# Patient Record
Sex: Female | Born: 1955 | Race: Black or African American | Hispanic: No | Marital: Married | Smoking: Never smoker
Health system: Southern US, Community
[De-identification: ages and names within clinical notes are randomized; demographics above are authoritative.]

## PROBLEM LIST (undated history)

## (undated) DIAGNOSIS — I10 Essential (primary) hypertension: Secondary | ICD-10-CM

## (undated) DIAGNOSIS — M1990 Unspecified osteoarthritis, unspecified site: Secondary | ICD-10-CM

## (undated) DIAGNOSIS — K219 Gastro-esophageal reflux disease without esophagitis: Secondary | ICD-10-CM

## (undated) DIAGNOSIS — E785 Hyperlipidemia, unspecified: Secondary | ICD-10-CM

## (undated) HISTORY — PX: NO PAST SURGERIES: SHX2092

## (undated) HISTORY — PX: OTHER SURGICAL HISTORY: SHX169

## (undated) HISTORY — DX: Hyperlipidemia, unspecified: E78.5

## (undated) HISTORY — DX: Gastro-esophageal reflux disease without esophagitis: K21.9

## (undated) HISTORY — PX: COLONOSCOPY: SHX5424

## (undated) HISTORY — DX: Essential (primary) hypertension: I10

---

## 2021-02-15 ENCOUNTER — Other Ambulatory Visit: Payer: Self-pay

## 2021-02-15 ENCOUNTER — Ambulatory Visit (INDEPENDENT_AMBULATORY_CARE_PROVIDER_SITE_OTHER): Payer: PRIVATE HEALTH INSURANCE | Admitting: Physician Assistant

## 2021-02-15 ENCOUNTER — Encounter: Payer: Self-pay | Admitting: Physician Assistant

## 2021-02-15 ENCOUNTER — Telehealth (INDEPENDENT_AMBULATORY_CARE_PROVIDER_SITE_OTHER): Payer: PRIVATE HEALTH INSURANCE | Admitting: Physician Assistant

## 2021-02-15 VITALS — BP 130/82 | HR 68 | Ht 63.75 in | Wt 180.2 lb

## 2021-02-15 DIAGNOSIS — K219 Gastro-esophageal reflux disease without esophagitis: Secondary | ICD-10-CM

## 2021-02-15 DIAGNOSIS — K6289 Other specified diseases of anus and rectum: Secondary | ICD-10-CM

## 2021-02-15 DIAGNOSIS — Z1211 Encounter for screening for malignant neoplasm of colon: Secondary | ICD-10-CM

## 2021-02-15 MED ORDER — PLENVU 140 G PO SOLR: ORAL | 0 refills | Status: DC

## 2021-02-15 NOTE — Patient Instructions (Signed)
If you are age 65 or older, your body mass index should be between 23-30. Your Body mass index is 31.17 kg/m. If this is out of the aforementioned range listed, please consider follow up with your Primary Care Provider.  If you are age 19 or younger, your body mass index should be between 19-25. Your Body mass index is 31.17 kg/m. If this is out of the aformentioned range listed, please consider follow up with your Primary Care Provider.   You have been scheduled for a colonoscopy. Please follow written instructions given to you at your visit today.  Please pick up your prep supplies at the pharmacy within the next 1-3 days. If you use inhalers (even only as needed), please bring them with you on the day of your procedure.  Due to recent changes in healthcare laws, you may see the results of your imaging and laboratory studies on MyChart before your provider has had a chance to review them.  We understand that in some cases there may be results that are confusing or concerning to you. Not all laboratory results come back in the same time frame and the provider may be waiting for multiple results in order to interpret others.  Please give [place] 48 hours in order for your provider to thoroughly review all the results before contacting the office for clarification of your results.    The [place] [facility] providers would like to encourage you to use [facility] to communicate with providers for non-urgent requests or questions.  Due to long hold times on the telephone, sending your provider a message by [facility] may be a faster and more efficient way to get a response.  Please allow 48 business hours for a response.  Please remember that this is for non-urgent requests.   It was a pleasure to see you today!  Thank you for trusting me with your gastrointestinal care!    Ellouise Newer , PA-C

## 2021-02-15 NOTE — Telephone Encounter (Signed)
Opened in error.  JLL

## 2021-02-15 NOTE — Progress Notes (Signed)
Chief Complaint: Discuss colonoscopy, rectal pain and GERD  HPI:    Monique Shannon is a 65 year old [place] female with a past medical history as listed below, who presents to clinic today to discuss a screening colonoscopy as well as a complaint of rectal pain and GERD.    Today, patient presents to clinic accompanied by her son, she is originally from [place] and her son helps translate for her.  Together they explain that she has had chronic reflux for years, but this only comes on after she "eats something that starts it", which is not very often.  When it does she uses Omeprazole and it goes away.  Denies any abdominal pain or dysphagia.    Also discusses a rectal discomfort which she will have that "comes out of nowhere".  She tells me sometimes she can just be laying down at night and she will have a pain in her "anus", this will last for 3 to 5 minutes and then goes away.  It has nothing to do with bowel movements.  No other abdominal pain.  No blood in her stool.  This only happens about once every 2 months.    Denies previous screening for colon cancer.    Patient is only in town with visiting her son from [place] for the next 2 months and he would like her to have procedure before she leaves.    Denies fever, chills, weight loss or symptoms that awaken her from sleep.  Past Medical History:  Diagnosis Date  . GERD (gastroesophageal reflux disease)   . Hyperlipidemia   . Hypertension     Past Surgical History:  Procedure Laterality Date  . none      Current Outpatient Medications  Medication Sig Dispense Refill  . amLODipine (NORVASC) 10 MG tablet Take 1 tablet by mouth daily.    . meloxicam (MOBIC) 15 MG tablet Take 1 tablet by mouth daily.    . nortriptyline (PAMELOR) 10 MG capsule Take 1 capsule by mouth daily.    Marland Kitchen omeprazole (PRILOSEC) 40 MG capsule Take 1 capsule by mouth daily as needed.    Marland Kitchen PEG-KCl-NaCl-NaSulf-Na Asc-C (PLENVU) 140 g SOLR Use as directed.  Manufacturer's coupon Universal coupon code:BIN: P2366821; GROUP: [id number]; PCN: [id number]; ID: [id number]; PAY NO MORE $50; NO prior authorization 1 each 0  . rosuvastatin (CRESTOR) 10 MG tablet Take 1 tablet by mouth every evening.     No current facility-administered medications for this visit.    Allergies as of 02/15/2021 - Review Complete 02/15/2021  Allergen Reaction Noted  . Other  02/15/2021    Family History  Problem Relation Age of Onset  . Colon cancer Neg Hx   . Esophageal cancer Neg Hx   . Pancreatic cancer Neg Hx   . Stomach cancer Neg Hx   . Liver disease Neg Hx     Social History   Socioeconomic History  . Marital status: Married    Spouse name: Not on file  . Number of children: Not on file  . Years of education: Not on file  . Highest education level: Not on file  Occupational History  . Not on file  Tobacco Use  . Smoking status: Never Smoker  . Smokeless tobacco: Never Used  Substance and Sexual Activity  . Alcohol use: Yes    Comment: rare  . Drug use: Never  . Sexual activity: Not on file  Other Topics Concern  . Not on file  Social History Narrative  .  Not on file   Social Determinants of Health   Financial Resource Strain: Not on file  Food Insecurity: Not on file  Transportation Needs: Not on file  Physical Activity: Not on file  Stress: Not on file  Social Connections: Not on file  Intimate Partner Violence: Not on file    Review of Systems:    Constitutional: No weight loss, fever or chills Skin: No rash Cardiovascular: No chest pain Respiratory: No SOB Gastrointestinal: See HPI and otherwise negative Genitourinary: No dysuria Neurological: No headache, dizziness or syncope Musculoskeletal: No new muscle or joint pain Hematologic: No bleeding Psychiatric: No history of depression or anxiety   Physical Exam:  Vital signs: BP 130/82   Pulse 68   Ht 5' 3.75" (1.619 m)   Wt 180 lb 3.2 oz (81.7 kg)   BMI 31.17 kg/m    Constitutional:   Pleasant AA female appears to be in NAD, Well developed, Well nourished, alert and cooperative Head:  Normocephalic and atraumatic. Eyes:   PEERL, EOMI. No icterus. Conjunctiva pink. Ears:  Normal auditory acuity. Neck:  Supple Throat: Oral cavity and pharynx without inflammation, swelling or lesion.  Respiratory: Respirations even and unlabored. Lungs clear to auscultation bilaterally.   No wheezes, crackles, or rhonchi.  Cardiovascular: Normal S1, S2. No MRG. Regular rate and rhythm. No peripheral edema, cyanosis or pallor.  Gastrointestinal:  Soft, nondistended, nontender. No rebound or guarding. Normal bowel sounds. No appreciable masses or hepatomegaly. Rectal:  Not performed.  Msk:  Symmetrical without gross deformities. Without edema, no deformity or joint abnormality.  Neurologic:  Alert and  oriented x4;  grossly normal neurologically.  Skin:   Dry and intact without significant lesions or rashes. Psychiatric:  Demonstrates good judgement and reason without abnormal affect or behaviors.  No recent labs or imaging.  Assessment: 1.  Screening for colorectal cancer: Patient is 62 and never had a screening colonoscopy 2.  Rectal pain: Once every 2 months, last for 3 to 5 minutes, unrelated to bowel movements, no other abdominal pain or change in stools, no rectal bleeding; consider musculoskeletal versus proctalgia fugax versus other 3.  GERD: Very occasional, related to diet  Plan: 1.  Scheduled patient for screening colonoscopy in the [place] with Dr. Ardis Hughs.  Did provide the patient a detailed list of risks for the procedure and she agrees to proceed. 2.  Briefly discussed reflux and antireflux diet and lifestyle modifications.  The symptoms are not frequent enough for her to be on medicine.  She can continue Omeprazole as needed 3.  Briefly discussed rectal pain, not sure if this is truly related to her GI system, could be musculoskeletal as it seems to come and go  once every couple of months.  Certainly we will take a good look in her rectum at time of colonoscopy. 4.  Patient to follow in clinic per recommendations from Dr. Ardis Hughs after time of procedure.  Ellouise Newer, PA-C [facility] 02/15/2021, 10:30 AM

## 2021-02-18 ENCOUNTER — Telehealth: Payer: Self-pay | Admitting: Physician Assistant

## 2021-02-18 ENCOUNTER — Other Ambulatory Visit: Payer: Self-pay

## 2021-02-18 NOTE — Progress Notes (Signed)
I agree with the above note, plan 

## 2021-02-18 NOTE — Telephone Encounter (Signed)
Patient called requesting a generic brand prep medication due to cost for the Plenvu. Procedure is scheduled for this Weds would need it by today.

## 2021-02-18 NOTE — Telephone Encounter (Signed)
Returned patient's call and spoke with her son and asked if he could come pick up a sample prep of Plenvu. He stated that he would come pick up prep around 3:30 pm.

## 2021-02-20 ENCOUNTER — Encounter: Payer: Self-pay | Admitting: Gastroenterology

## 2021-02-20 ENCOUNTER — Other Ambulatory Visit: Payer: Self-pay | Admitting: Internal Medicine

## 2021-02-20 ENCOUNTER — Ambulatory Visit (AMBULATORY_SURGERY_CENTER): Payer: PRIVATE HEALTH INSURANCE | Admitting: Gastroenterology

## 2021-02-20 ENCOUNTER — Other Ambulatory Visit: Payer: Self-pay

## 2021-02-20 VITALS — BP 141/85 | HR 80 | Temp 96.8°F | Resp 17 | Ht 63.75 in | Wt 180.0 lb

## 2021-02-20 DIAGNOSIS — D122 Benign neoplasm of ascending colon: Secondary | ICD-10-CM

## 2021-02-20 DIAGNOSIS — K6289 Other specified diseases of anus and rectum: Secondary | ICD-10-CM | POA: Diagnosis not present

## 2021-02-20 DIAGNOSIS — E2839 Other primary ovarian failure: Secondary | ICD-10-CM

## 2021-02-20 DIAGNOSIS — Z1211 Encounter for screening for malignant neoplasm of colon: Secondary | ICD-10-CM

## 2021-02-20 MED ORDER — SODIUM CHLORIDE 0.9 % IV SOLN: 500.0000 mL | Freq: Once | INTRAVENOUS | Status: DC

## 2021-02-20 NOTE — Progress Notes (Signed)
pt tolerated well. VSS. awake and to recovery. Report given to RN.  

## 2021-02-20 NOTE — Patient Instructions (Signed)
Discharge instructions given. Handout on polyps. Resume previous medications. YOU HAD AN ENDOSCOPIC PROCEDURE TODAY AT THE [facility]:   Refer to the procedure report that was given to you for any specific questions about what was found during the examination.  If the procedure report does not answer your questions, please call your gastroenterologist to clarify.  If you requested that your care partner not be given the details of your procedure findings, then the procedure report has been included in a sealed envelope for you to review at your convenience later.  YOU SHOULD EXPECT: Some feelings of bloating in the abdomen. Passage of more gas than usual.  Walking can help get rid of the air that was put into your GI tract during the procedure and reduce the bloating. If you had a lower endoscopy (such as a colonoscopy or flexible sigmoidoscopy) you may notice spotting of blood in your stool or on the toilet paper. If you underwent a bowel prep for your procedure, you may not have a normal bowel movement for a few days.  Please Note:  You might notice some irritation and congestion in your nose or some drainage.  This is from the oxygen used during your procedure.  There is no need for concern and it should clear up in a day or so.  SYMPTOMS TO REPORT IMMEDIATELY:  Following lower endoscopy (colonoscopy or flexible sigmoidoscopy):  Excessive amounts of blood in the stool  Significant tenderness or worsening of abdominal pains  Swelling of the abdomen that is new, acute  Fever of 100F or higher   For urgent or emergent issues, a gastroenterologist can be reached at any hour by calling [phone]. Do not use MyChart messaging for urgent concerns.    DIET:  We do recommend a small meal at first, but then you may proceed to your regular diet.  Drink plenty of fluids but you should avoid alcoholic beverages for 24 hours.  ACTIVITY:  You should plan to take it easy for the rest  of today and you should NOT DRIVE or use heavy machinery until tomorrow (because of the sedation medicines used during the test).    FOLLOW UP: Our staff will call the number listed on your records 48-72 hours following your procedure to check on you and address any questions or concerns that you may have regarding the information given to you following your procedure. If we do not reach you, we will leave a message.  We will attempt to reach you two times.  During this call, we will ask if you have developed any symptoms of COVID 19. If you develop any symptoms (ie: fever, flu-like symptoms, shortness of breath, cough etc.) before then, please call [phone].  If you test positive for Covid 19 in the 2 weeks post procedure, please call and report this information to us.    If any biopsies were taken you will be contacted by phone or by letter within the next 1-3 weeks.  Please call us at [phone] if you have not heard about the biopsies in 3 weeks.    SIGNATURES/CONFIDENTIALITY: You and/or your care partner have signed paperwork which will be entered into your electronic medical record.  These signatures attest to the fact that that the information above on your After Visit Summary has been reviewed and is understood.  Full responsibility of the confidentiality of this discharge information lies with you and/or your care-partner.  

## 2021-02-20 NOTE — Op Note (Signed)
[place] Patient Name: Monique Shannon Procedure Date: 02/20/2021 2:50 PM MRN: [id number] Endoscopist: Milus Banister , MD Age: 64 Referring MD:  Date of Birth: January 01, 1956 Gender: Female Account #: [id number] Procedure:                Colonoscopy Indications:              Screening for colorectal malignant neoplasm, also                            intermittent rectal pain Medicines:                Monitored Anesthesia Care Procedure:                Pre-Anesthesia Assessment:                           - Prior to the procedure, a History and Physical                            was performed, and patient medications and                            allergies were reviewed. The patient's tolerance of                            previous anesthesia was also reviewed. The risks                            and benefits of the procedure and the sedation                            options and risks were discussed with the patient.                            All questions were answered, and informed consent                            was obtained. Prior Anticoagulants: The patient has                            taken no previous anticoagulant or antiplatelet                            agents. ASA Grade Assessment: II - A patient with                            mild systemic disease. After reviewing the risks                            and benefits, the patient was deemed in                            satisfactory condition to undergo the procedure.  After obtaining informed consent, the colonoscope                            was passed under direct vision. Throughout the                            procedure, the patient's blood pressure, pulse, and                            oxygen saturations were monitored continuously. The                            Olympus CF-HQ190 [id number]) Colonoscope was                            introduced through the anus and  advanced to the the                            cecum, identified by appendiceal orifice and                            ileocecal valve. The colonoscopy was performed                            without difficulty. The patient tolerated the                            procedure well. The quality of the bowel                            preparation was good. The ileocecal valve,                            appendiceal orifice, and rectum were photographed. Scope In: 3:17:19 PM Scope Out: 3:29:45 PM Scope Withdrawal Time: 0 hours 7 minutes 15 seconds  Total Procedure Duration: 0 hours 12 minutes 26 seconds  Findings:                 Two sessile polyps were found in the ascending                            colon. The polyps were 2 to 3 mm in size. These                            polyps were removed with a cold snare. Resection                            and retrieval were complete.                           The exam was otherwise without abnormality on                            direct and retroflexion views. Complications:  No immediate complications. Estimated blood loss:                            None. Estimated Blood Loss:     Estimated blood loss: none. Impression:               - Two 2 to 3 mm polyps in the ascending colon,                            removed with a cold snare. Resected and retrieved.                           - The examination was otherwise normal on direct                            and retroflexion views. Recommendation:           - Patient has a contact number available for                            emergencies. The signs and symptoms of potential                            delayed complications were discussed with the                            patient. Return to normal activities tomorrow.                            Written discharge instructions were provided to the                            patient.                           - Resume previous diet.                            - Continue present medications.                           - Await pathology results. Milus Banister, MD 02/20/2021 3:35:31 PM This report has been signed electronically.

## 2021-02-20 NOTE — Progress Notes (Signed)
Called to room to assist during endoscopic procedure.  Patient ID and intended procedure confirmed with present staff. Received instructions for my participation in the procedure from the performing physician.  

## 2021-02-20 NOTE — Progress Notes (Signed)
VS by [facility]

## 2021-02-22 ENCOUNTER — Telehealth: Payer: Self-pay | Admitting: *Deleted

## 2021-02-22 NOTE — Telephone Encounter (Signed)
  Follow up Call-  Call back number 02/20/2021  Post procedure Call Back phone  # [phone]  Permission to leave phone message Yes     Patient questions:  Do you have a fever, pain , or abdominal swelling? No. Pain Score  0 *  Have you tolerated food without any problems? Yes.    Have you been able to return to your normal activities? Yes.    Do you have any questions about your discharge instructions: Diet   No. Medications  No. Follow up visit  No.  Do you have questions or concerns about your Care? No.  Actions: * If pain score is 4 or above: No action needed, pain <4.  Have you developed a fever since your procedure? no  2.   Have you had an respiratory symptoms (SOB or cough) since your procedure? no  3.   Have you tested positive for COVID 19 since your procedure no  4.   Have you had any family members/close contacts diagnosed with the COVID 19 since your procedure?  no   If yes to any of these questions please route to Joylene John, RN and Joella Prince, RN

## 2021-02-27 ENCOUNTER — Other Ambulatory Visit: Payer: Self-pay | Admitting: Internal Medicine

## 2021-02-27 DIAGNOSIS — E2839 Other primary ovarian failure: Secondary | ICD-10-CM

## 2021-02-28 ENCOUNTER — Ambulatory Visit
Admission: RE | Admit: 2021-02-28 | Discharge: 2021-02-28 | Disposition: A | Discharge status: Home / Self Care | Payer: PRIVATE HEALTH INSURANCE | Source: Ambulatory Visit | Attending: Internal Medicine | Admitting: Internal Medicine

## 2021-02-28 ENCOUNTER — Other Ambulatory Visit: Payer: Self-pay

## 2021-02-28 DIAGNOSIS — E2839 Other primary ovarian failure: Secondary | ICD-10-CM

## 2021-02-28 DIAGNOSIS — M19011 Primary osteoarthritis, right shoulder: Secondary | ICD-10-CM | POA: Diagnosis present

## 2021-03-01 ENCOUNTER — Other Ambulatory Visit: Payer: Self-pay

## 2021-03-01 ENCOUNTER — Ambulatory Visit
Admission: RE | Admit: 2021-03-01 | Discharge: 2021-03-01 | Disposition: A | Discharge status: Home / Self Care | Payer: PRIVATE HEALTH INSURANCE | Source: Ambulatory Visit | Attending: Internal Medicine | Admitting: Internal Medicine

## 2021-03-01 DIAGNOSIS — E2839 Other primary ovarian failure: Secondary | ICD-10-CM

## 2021-03-04 ENCOUNTER — Encounter: Payer: Self-pay | Admitting: Gastroenterology

## 2021-03-05 ENCOUNTER — Ambulatory Visit (HOSPITAL_COMMUNITY)
Admission: EM | Admit: 2021-03-05 | Discharge: 2021-03-05 | Disposition: A | Discharge status: Home / Self Care | Payer: PRIVATE HEALTH INSURANCE | Attending: Emergency Medicine | Admitting: Emergency Medicine

## 2021-03-05 ENCOUNTER — Encounter (HOSPITAL_COMMUNITY): Payer: Self-pay | Admitting: Emergency Medicine

## 2021-03-05 ENCOUNTER — Ambulatory Visit (INDEPENDENT_AMBULATORY_CARE_PROVIDER_SITE_OTHER): Payer: PRIVATE HEALTH INSURANCE

## 2021-03-05 ENCOUNTER — Other Ambulatory Visit: Payer: Self-pay

## 2021-03-05 DIAGNOSIS — R079 Chest pain, unspecified: Secondary | ICD-10-CM

## 2021-03-05 DIAGNOSIS — R0789 Other chest pain: Secondary | ICD-10-CM | POA: Diagnosis not present

## 2021-03-05 DIAGNOSIS — S161XXA Strain of muscle, fascia and tendon at neck level, initial encounter: Secondary | ICD-10-CM | POA: Diagnosis not present

## 2021-03-05 DIAGNOSIS — M25561 Pain in right knee: Secondary | ICD-10-CM | POA: Diagnosis not present

## 2021-03-05 DIAGNOSIS — S8001XA Contusion of right knee, initial encounter: Secondary | ICD-10-CM

## 2021-03-05 NOTE — Discharge Instructions (Addendum)
Your x-rays were negative.  Rest ice to affected areas may take over-the-counter Tylenol as label directed for discomfort.  May also try a lidocaine patch that she can get over-the-counter.  Follow-up with your PCP
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
## 2021-03-11 NOTE — Progress Notes (Signed)
Pt. Needs orders for upcoming surgery.PAT and labs on: 03/12/21.

## 2021-03-11 NOTE — Patient Instructions (Addendum)
DUE TO COVID-19 ONLY ONE VISITOR IS ALLOWED TO COME WITH YOU AND STAY IN THE WAITING ROOM ONLY DURING PRE OP AND PROCEDURE DAY OF SURGERY. THE 1 VISITOR  MAY VISIT WITH YOU AFTER SURGERY IN YOUR PRIVATE ROOM DURING VISITING HOURS ONLY!               Monique Shannon   Your procedure is scheduled on: 03/26/21   Report to [facility] Main  Entrance   Report to admitting at: 8:20 AM     Call this number if you have problems the morning of surgery [phone]    Remember: NO SOLID FOOD AFTER MIDNIGHT THE NIGHT PRIOR TO SURGERY. NOTHING BY MOUTH EXCEPT CLEAR LIQUIDS UNTIL: 7:50 AM . PLEASE FINISH ENSURE DRINK PER SURGEON ORDER  WHICH NEEDS TO BE COMPLETED AT : 7:50 AM.   CLEAR LIQUID DIET  Foods Allowed                                                                     Foods Excluded  Coffee and tea, regular and decaf                             liquids that you cannot  Plain Jell-O any favor except red or purple                                           see through such as: Fruit ices (not with fruit pulp)                                     milk, soups, orange juice  Iced Popsicles                                    All solid food Carbonated beverages, regular and diet                                    Cranberry, grape and apple juices Sports drinks like Gatorade Lightly seasoned clear broth or consume(fat free) Sugar, honey syrup  Sample Menu Breakfast                                Lunch                                     Supper Cranberry juice                    Beef broth                            Chicken broth Jell-O  Grape juice                           Apple juice Coffee or tea                        Jell-O                                      Popsicle                                                Coffee or tea                        Coffee or tea  _____________________________________________________________________   BRUSH  YOUR TEETH MORNING OF SURGERY AND RINSE YOUR MOUTH OUT, NO CHEWING GUM CANDY OR MINTS.    Take these medicines the morning of surgery with A SIP OF WATER: amlodipine,omeprazole.                               You may not have any metal on your body including hair pins and              piercings  Do not wear jewelry, make-up, lotions, powders or perfumes, deodorant             Do not wear nail polish on your fingernails.  Do not shave  48 hours prior to surgery.    Do not bring valuables to the hospital. [place].  Contacts, dentures or bridgework may not be worn into surgery.  Leave suitcase in the car. After surgery it may be brought to your room.     Patients discharged the day of surgery will not be allowed to drive home. IF YOU ARE HAVING SURGERY AND GOING HOME THE SAME DAY, YOU MUST HAVE AN ADULT TO DRIVE YOU HOME AND BE WITH YOU FOR 24 HOURS. YOU MAY GO HOME BY TAXI OR UBER OR ORTHERWISE, BUT AN ADULT MUST ACCOMPANY YOU HOME AND STAY WITH YOU FOR 24 HOURS.  Name and phone number of your driver:  Special Instructions: N/A              Please read over the following fact sheets you were given: _____________________________________________________________________           [facility] - Preparing for Surgery Before surgery, you can play an important role.  Because skin is not sterile, your skin needs to be as free of germs as possible.  You can reduce the number of germs on your skin by washing with CHG (chlorahexidine gluconate) soap before surgery.  CHG is an antiseptic cleaner which kills germs and bonds with the skin to continue killing germs even after washing. Please DO NOT use if you have an allergy to CHG or antibacterial soaps.  If your skin becomes reddened/irritated stop using the CHG and inform your nurse when you arrive at Short Stay. Do not shave (including legs and underarms) for at least 48 hours prior to the first  CHG  shower.  You may shave your face/neck. Please follow these instructions carefully:  1.  Shower with CHG Soap the night before surgery and the  morning of Surgery.  2.  If you choose to wash your hair, wash your hair first as usual with your  normal  shampoo.  3.  After you shampoo, rinse your hair and body thoroughly to remove the  shampoo.                           4.  Use CHG as you would any other liquid soap.  You can apply chg directly  to the skin and wash                       Gently with a scrungie or clean washcloth.  5.  Apply the CHG Soap to your body ONLY FROM THE NECK DOWN.   Do not use on face/ open                           Wound or open sores. Avoid contact with eyes, ears mouth and genitals (private parts).                       Wash face,  Genitals (private parts) with your normal soap.             6.  Wash thoroughly, paying special attention to the area where your surgery  will be performed.  7.  Thoroughly rinse your body with warm water from the neck down.  8.  DO NOT shower/wash with your normal soap after using and rinsing off  the CHG Soap.                9.  Pat yourself dry with a clean towel.            10.  Wear clean pajamas.            11.  Place clean sheets on your bed the night of your first shower and do not  sleep with pets. Day of Surgery : Do not apply any lotions/deodorants the morning of surgery.  Please wear clean clothes to the hospital/surgery center.  FAILURE TO FOLLOW THESE INSTRUCTIONS MAY RESULT IN THE CANCELLATION OF YOUR SURGERY PATIENT SIGNATURE_________________________________  NURSE SIGNATURE__________________________________  ________________________________________________________________________    [facility]- Preparing for Total Shoulder Arthroplasty    Before surgery, you can play an important role. Because skin is not sterile, your skin needs to be as free of germs as possible. You can reduce the number of germs on your skin by  using the following products. Benzoyl Peroxide Gel Reduces the number of germs present on the skin Applied twice a day to shoulder area starting two days before surgery    ==================================================================  Please follow these instructions carefully:  BENZOYL PEROXIDE 5% GEL  Please do not use if you have an allergy to benzoyl peroxide.   If your skin becomes reddened/irritated stop using the benzoyl peroxide.  Starting two days before surgery, apply as follows: Apply benzoyl peroxide in the morning and at night. Apply after taking a shower. If you are not taking a shower clean entire shoulder front, back, and side along with the armpit with a clean wet washcloth.  Place a quarter-sized dollop on your shoulder and rub in thoroughly, making sure to cover the  front, back, and side of your shoulder, along with the armpit.   2 days before ____ AM   ____ PM              1 day before ____ AM   ____ PM                         Do this twice a day for two days.  (Last application is the night before surgery, AFTER using the CHG soap as described below).  Do NOT apply benzoyl peroxide gel on the day of surgery.   Incentive Spirometer  An incentive spirometer is a tool that can help keep your lungs clear and active. This tool measures how well you are filling your lungs with each breath. Taking long deep breaths may help reverse or decrease the chance of developing breathing (pulmonary) problems (especially infection) following: A long period of time when you are unable to move or be active. BEFORE THE PROCEDURE  If the spirometer includes an indicator to show your best effort, your nurse or respiratory therapist will set it to a desired goal. If possible, sit up straight or lean slightly forward. Try not to slouch. Hold the incentive spirometer in an upright position. INSTRUCTIONS FOR USE  Sit on the edge of your bed if possible, or sit up as far as you can in  bed or on a chair. Hold the incentive spirometer in an upright position. Breathe out normally. Place the mouthpiece in your mouth and seal your lips tightly around it. Breathe in slowly and as deeply as possible, raising the piston or the ball toward the top of the column. Hold your breath for 3-5 seconds or for as long as possible. Allow the piston or ball to fall to the bottom of the column. Remove the mouthpiece from your mouth and breathe out normally. Rest for a few seconds and repeat Steps 1 through 7 at least 10 times every 1-2 hours when you are awake. Take your time and take a few normal breaths between deep breaths. The spirometer may include an indicator to show your best effort. Use the indicator as a goal to work toward during each repetition. After each set of 10 deep breaths, practice coughing to be sure your lungs are clear. If you have an incision (the cut made at the time of surgery), support your incision when coughing by placing a pillow or rolled up towels firmly against it. Once you are able to get out of bed, walk around indoors and cough well. You may stop using the incentive spirometer when instructed by your caregiver.  RISKS AND COMPLICATIONS Take your time so you do not get dizzy or light-headed. If you are in pain, you may need to take or ask for pain medication before doing incentive spirometry. It is harder to take a deep breath if you are having pain. AFTER USE Rest and breathe slowly and easily. It can be helpful to keep track of a log of your progress. Your caregiver can provide you with a simple table to help with this. If you are using the spirometer at home, follow these instructions: [place] IF:  You are having difficultly using the spirometer. You have trouble using the spirometer as often as instructed. Your pain medication is not giving enough relief while using the spirometer. You develop fever of 100.5 F (38.1 C) or higher. SEEK IMMEDIATE  MEDICAL CARE IF:  You cough up  bloody sputum that had not been present before. You develop fever of 102 F (38.9 C) or greater. You develop worsening pain at or near the incision site. MAKE SURE YOU:  Understand these instructions. Will watch your condition. Will get help right away if you are not doing well or get worse. Document Released: 01/12/2007 Document Revised: 11/24/2011 Document Reviewed: 03/15/2007 [facility] Patient Information 2014 [place], [place].   ________________________________________________________________________

## 2021-03-12 ENCOUNTER — Encounter (HOSPITAL_COMMUNITY)
Admission: RE | Admit: 2021-03-12 | Discharge: 2021-03-12 | Disposition: A | Discharge status: Home / Self Care | Payer: PRIVATE HEALTH INSURANCE | Source: Ambulatory Visit | Attending: Orthopedic Surgery | Admitting: Orthopedic Surgery

## 2021-03-12 ENCOUNTER — Other Ambulatory Visit: Payer: Self-pay

## 2021-03-12 ENCOUNTER — Encounter (HOSPITAL_COMMUNITY): Payer: Self-pay

## 2021-03-12 DIAGNOSIS — Z01818 Encounter for other preprocedural examination: Secondary | ICD-10-CM | POA: Insufficient documentation

## 2021-03-12 HISTORY — DX: Unspecified osteoarthritis, unspecified site: M19.90

## 2021-03-12 LAB — BASIC METABOLIC PANEL
Anion gap: 5 (ref 5–15)
BUN: 15 mg/dL (ref 8–23)
CO2: 27 mmol/L (ref 22–32)
Calcium: 9.7 mg/dL (ref 8.9–10.3)
Chloride: 107 mmol/L (ref 98–111)
Creatinine, Ser: 1.01 mg/dL — ABNORMAL HIGH (ref 0.44–1.00)
GFR, Estimated: >60 mL/min (ref >60)
Glucose, Bld: 100 mg/dL — ABNORMAL HIGH (ref 70–99)
Potassium: 4 mmol/L (ref 3.5–5.1)
Sodium: 139 mmol/L (ref 135–145)

## 2021-03-12 LAB — SURGICAL PCR SCREEN
MRSA, PCR: NEGATIVE
Staphylococcus aureus: NEGATIVE

## 2021-03-12 LAB — CBC
HCT: 40 % (ref 36.0–46.0)
Hemoglobin: 13.5 g/dL (ref 12.0–15.0)
MCH: 29.2 pg (ref 26.0–34.0)
MCHC: 33.8 g/dL (ref 30.0–36.0)
MCV: 86.4 fL (ref 80.0–100.0)
Platelets: 269 10*3/uL (ref 150–400)
RBC: 4.63 MIL/uL (ref 3.87–5.11)
RDW: 13.7 % (ref 11.5–15.5)
WBC: 6.6 10*3/uL (ref 4.0–10.5)
nRBC: 0 % (ref 0.0–0.2)

## 2021-03-12 NOTE — Progress Notes (Signed)
COVID Vaccine Completed: Yes Date COVID Vaccine completed: 02/2021. COVID vaccine manufacturer:   [place]. Cardiologist -   Chest x-ray -  EKG -  Stress Test -  ECHO -  Cardiac Cath -  Pacemaker/ICD device last checked:  Sleep Study -  CPAP -   Fasting Blood Sugar -  Checks Blood Sugar _____ times a day  Blood Thinner Instructions: Aspirin Instructions: Last Dose:  Anesthesia review:   Patient denies shortness of breath, fever, cough and chest pain at PAT appointment   Patient verbalized understanding of instructions that were given to them at the PAT appointment. Patient was also instructed that they will need to review over the PAT instructions again at home before surgery.

## 2021-03-26 ENCOUNTER — Ambulatory Visit (HOSPITAL_COMMUNITY)
Admission: RE | Admit: 2021-03-26 | Payer: PRIVATE HEALTH INSURANCE | Source: Ambulatory Visit | Admitting: Orthopedic Surgery

## 2021-03-26 ENCOUNTER — Encounter (HOSPITAL_COMMUNITY): Admission: RE | Payer: Self-pay | Source: Ambulatory Visit

## 2021-03-26 SURGERY — ARTHROPLASTY, SHOULDER, TOTAL
Anesthesia: Choice | Site: Shoulder | Laterality: Right

## 2021-04-01 ENCOUNTER — Emergency Department (HOSPITAL_COMMUNITY)
Admission: EM | Admit: 2021-04-01 | Discharge: 2021-04-01 | Disposition: A | Discharge status: Home / Self Care | Payer: PRIVATE HEALTH INSURANCE | Attending: Student | Admitting: Student

## 2021-04-01 ENCOUNTER — Other Ambulatory Visit: Payer: Self-pay

## 2021-04-01 ENCOUNTER — Emergency Department (HOSPITAL_COMMUNITY): Payer: PRIVATE HEALTH INSURANCE

## 2021-04-01 DIAGNOSIS — Z5321 Procedure and treatment not carried out due to patient leaving prior to being seen by health care provider: Secondary | ICD-10-CM | POA: Insufficient documentation

## 2021-04-01 DIAGNOSIS — Y92410 Unspecified street and highway as the place of occurrence of the external cause: Secondary | ICD-10-CM | POA: Insufficient documentation

## 2021-04-01 DIAGNOSIS — M542 Cervicalgia: Secondary | ICD-10-CM | POA: Insufficient documentation
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
## 2021-04-01 NOTE — ED Notes (Signed)
Pt called for room, no response. 

## 2022-05-21 ENCOUNTER — Other Ambulatory Visit: Payer: Self-pay

## 2022-05-21 DIAGNOSIS — Z1231 Encounter for screening mammogram for malignant neoplasm of breast: Secondary | ICD-10-CM

## 2022-05-23 DIAGNOSIS — M19011 Primary osteoarthritis, right shoulder: Secondary | ICD-10-CM | POA: Diagnosis not present

## 2022-06-06 ENCOUNTER — Ambulatory Visit
Admission: RE | Admit: 2022-06-06 | Discharge: 2022-06-06 | Disposition: A | Discharge status: Home / Self Care | Payer: 59 | Source: Ambulatory Visit | Attending: Internal Medicine | Admitting: Internal Medicine

## 2022-06-06 DIAGNOSIS — Z1231 Encounter for screening mammogram for malignant neoplasm of breast: Secondary | ICD-10-CM

## 2022-06-11 ENCOUNTER — Ambulatory Visit: Payer: Self-pay

## 2022-06-16 ENCOUNTER — Ambulatory Visit (INDEPENDENT_AMBULATORY_CARE_PROVIDER_SITE_OTHER): Payer: 59

## 2022-06-16 ENCOUNTER — Other Ambulatory Visit: Payer: Self-pay

## 2022-06-16 ENCOUNTER — Encounter (HOSPITAL_COMMUNITY): Payer: Self-pay | Admitting: Emergency Medicine

## 2022-06-16 ENCOUNTER — Ambulatory Visit (HOSPITAL_COMMUNITY)
Admission: EM | Admit: 2022-06-16 | Discharge: 2022-06-16 | Disposition: A | Discharge status: Home / Self Care | Payer: 59 | Attending: Family Medicine | Admitting: Family Medicine

## 2022-06-16 DIAGNOSIS — G8929 Other chronic pain: Secondary | ICD-10-CM

## 2022-06-16 DIAGNOSIS — M542 Cervicalgia: Secondary | ICD-10-CM

## 2022-06-16 DIAGNOSIS — R079 Chest pain, unspecified: Secondary | ICD-10-CM | POA: Diagnosis not present

## 2022-06-16 DIAGNOSIS — R0602 Shortness of breath: Secondary | ICD-10-CM | POA: Diagnosis not present

## 2022-06-16 DIAGNOSIS — M25512 Pain in left shoulder: Secondary | ICD-10-CM
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
## 2022-06-16 NOTE — Discharge Instructions (Signed)
You were seen today for intermittent shortness of breath.  Your EKG and chest xray were normal.  I do not see anything emergent at this time.  However, you should follow up with your primary care provider for further discussion and testing that we cannot do here.  Your neck and shoulder pain seem muscular in nature.  I recommend a heating pad if needed, tylenol for pain, and discuss with your doctor as well.

## 2022-06-16 NOTE — ED Triage Notes (Signed)
Pt reports for over month having intermittent SOB and chest pains. Denies happening everyday.  Takes omeprazole and Crestor "when needs it" per family with patient.  Pt adds that she will having intermittent gas as well.

## 2022-08-25 ENCOUNTER — Ambulatory Visit (HOSPITAL_COMMUNITY): Admission: EM | Admit: 2022-08-25 | Discharge: 2022-08-25 | Discharge status: Against Medical Advice | Payer: 59

## 2022-08-25 NOTE — ED Triage Notes (Signed)
No answer in waiting area x2 

## 2022-08-25 NOTE — ED Notes (Signed)
No answer in waiting area x2 

## 2022-08-26 ENCOUNTER — Emergency Department (HOSPITAL_COMMUNITY): Payer: 59

## 2022-08-26 ENCOUNTER — Encounter (HOSPITAL_COMMUNITY): Payer: Self-pay | Admitting: Emergency Medicine

## 2022-08-26 ENCOUNTER — Other Ambulatory Visit: Payer: Self-pay

## 2022-08-26 ENCOUNTER — Ambulatory Visit (HOSPITAL_COMMUNITY)
Admission: EM | Admit: 2022-08-26 | Discharge: 2022-08-26 | Disposition: A | Discharge status: Home / Self Care | Payer: 59

## 2022-08-26 ENCOUNTER — Ambulatory Visit (HOSPITAL_COMMUNITY): Payer: Self-pay

## 2022-08-26 ENCOUNTER — Emergency Department (HOSPITAL_COMMUNITY)
Admission: EM | Admit: 2022-08-26 | Discharge: 2022-08-26 | Disposition: A | Discharge status: Home / Self Care | Payer: 59 | Attending: Emergency Medicine | Admitting: Emergency Medicine

## 2022-08-26 DIAGNOSIS — G8929 Other chronic pain: Secondary | ICD-10-CM | POA: Diagnosis not present

## 2022-08-26 DIAGNOSIS — M19012 Primary osteoarthritis, left shoulder: Secondary | ICD-10-CM | POA: Diagnosis not present

## 2022-08-26 DIAGNOSIS — M25512 Pain in left shoulder: Secondary | ICD-10-CM | POA: Insufficient documentation

## 2022-08-26 DIAGNOSIS — Z79899 Other long term (current) drug therapy: Secondary | ICD-10-CM | POA: Diagnosis not present

## 2022-08-26 MED ORDER — IBUPROFEN 200 MG PO TABS: 400.0000 mg | ORAL_TABLET | Freq: Three times a day (TID) | ORAL | 0 refills | Status: AC | PRN

## 2022-08-26 NOTE — ED Triage Notes (Signed)
Pt stating that she has ongoing pain in her right shoulder. Pt gets injections in her shoulder for the pain.She states now her left shoulder has started hurting for about a month. Pain continues even when she is not moving her arms. Denies any injury

## 2022-08-26 NOTE — ED Notes (Addendum)
Pt was here today for cortisone injection spoke with providers on staff they do not do cortisone injections. Advised she can go to [facility] at an ortho office if she would like gave her information. Pt would like to cancel appt since they only want injection. Advised if they change their mind we are happy to see them.

## 2022-08-26 NOTE — ED Provider Notes (Signed)
[facility] EMERGENCY DEPARTMENT Provider Note   CSN: [id number] Arrival date & time: 08/26/22  5102     History  No chief complaint on file.   Monique Shannon is a 66 y.o. female.  66 year old female with prior medical history as detailed below presents for evaluation.  Patient complains of left shoulder pain.  This is been ongoing for the last 2 to 3 months.  Notably, patient with history of right shoulder pain followed by Dr. Mardelle Matte.  Patient was planning on having a right shoulder replacement.  However she changed her mind because the injections that Dr. Mardelle Matte gave her were sufficient to treat her pain related to osteoarthritis.  Patient reports increased lifting with her left arm over the last several months because her right shoulder was bothering her.  She denies any specific an acute inciting event.  She denies any fever.  She denies any direct trauma to the left shoulder anytime recently.  The history is provided by the patient and medical records.       Home Medications Prior to Admission medications   Medication Sig Start Date End Date Taking? Authorizing Provider  amLODipine (NORVASC) 10 MG tablet Take 10 mg by mouth daily. 02/13/21   [provider]  omeprazole (PRILOSEC) 40 MG capsule Take 40 mg by mouth daily as needed (acid reflux). 02/13/21   [provider]  rosuvastatin (CRESTOR) 10 MG tablet Take 10 mg by mouth every evening. 02/13/21   [provider]      Allergies    Other    Review of Systems   Review of Systems  All other systems reviewed and are negative.   Physical Exam Updated Vital Signs BP (!) 130/99 (BP Location: Right Arm)   Pulse 71   Temp 98.1 F (36.7 C) (Oral)   Resp 16   SpO2 99%  Physical Exam Vitals and nursing note reviewed.  Constitutional:      General: She is not in acute distress.    Appearance: Normal appearance. She is well-developed.  HENT:     Head: Normocephalic and  atraumatic.  Eyes:     Conjunctiva/sclera: Conjunctivae normal.     Pupils: Pupils are equal, round, and reactive to light.  Cardiovascular:     Rate and Rhythm: Normal rate and regular rhythm.     Heart sounds: Normal heart sounds.  Pulmonary:     Effort: Pulmonary effort is normal. No respiratory distress.     Breath sounds: Normal breath sounds.  Abdominal:     General: There is no distension.     Palpations: Abdomen is soft.     Tenderness: There is no abdominal tenderness.  Musculoskeletal:        General: No swelling, deformity or signs of injury. Normal range of motion.     Cervical back: Normal range of motion and neck supple.     Comments: Localizes pain to the left anterior and posterior shoulder.  No appreciable tenderness with palpation.  Full active range of motion of the left shoulder noted.  No overlying erythema.  No edema.  Left upper extremity is neurovascular intact.  Skin:    General: Skin is warm and dry.  Neurological:     General: No focal deficit present.     Mental Status: She is alert and oriented to person, place, and time.     ED Results / Procedures / Treatments   Labs (all labs ordered are listed, but only abnormal results are displayed)  Labs Reviewed - No data to display  EKG None  Radiology DG Shoulder Left  Result Date: 08/26/2022 CLINICAL DATA:  Shoulder pain EXAM: LEFT SHOULDER - 2+ VIEW COMPARISON:  04/01/2021 FINDINGS: Advanced chronic degenerative arthritis of the shoulder joint, progressive since last year. Joint space narrowing and osteophyte formation. Normal humeral acromial distance. Chronic widening of the [facility] joint with inferior spurs. IMPRESSION: Advanced chronic degenerative arthritis of the shoulder joint, progressive since last year. Electronically Signed   By: Nelson Chimes M.D.   On: 08/26/2022 10:06    Procedures Procedures    Medications Ordered in ED Medications - No data to display  ED Course/ Medical Decision Making/  A&P                           Medical Decision Making Risk OTC drugs.    Medical Screen Complete  This patient presented to the ED with complaint of left shoulder pain .  This complaint involves an extensive number of treatment options. The initial differential diagnosis includes, but is not limited to, osteoarthritis, occult fracture, etc.  This presentation is: Chronic, Self-Limited, Previously Undiagnosed, Uncertain Prognosis, Complicated, Systemic Symptoms, and Threat to Life/Bodily Function  Patient with chronic pain of both shoulders.  Patient has not yet seen her orthopedic provider for an evaluation of her left shoulder pain.  Imaging reveals evidence of osteoarthritis in the left shoulder.  Patient with longstanding history of osteoarthritis in the right shoulder as well.  Patient is encouraged to call Dr. Mardelle Matte and set up an appointment.  She apparently has received significant pain relief with intra-articular steroid injections in the past on the right shoulder.  Patient without indication of emergent condition requiring additional treatment here in the ED.  Importance of close follow-up is stressed.  Strict return precautions given and understood.  Patient reports that Tylenol and ibuprofen alleviates her pain significantly.  Patient is advised to be very careful with ibuprofen given her history of GERD.  She is advised to take ibuprofen with food.  She is advised to limit her ibuprofen intake because it could exacerbate her GERD symptoms.  Additional history obtained:  Additional history obtained from [facility] External records from outside sources obtained and reviewed including prior ED visits and prior Inpatient records.   Imaging Studies ordered:  I ordered imaging studies including left shoulder x-ray I independently visualized and interpreted obtained imaging which showed no acute pathology I agree with the radiologist interpretation. Problem List / ED  Course:  Left shoulder osteoarthritis   Reevaluation:  After the interventions noted above, I reevaluated the patient and found that they have: stayed the same   Disposition:  After consideration of the diagnostic results and the patients response to treatment, I feel that the patent would benefit from close outpatient follow-up.          Final Clinical Impression(s) / ED Diagnoses Final diagnoses:  Chronic left shoulder pain    Rx / DC Orders ED Discharge Orders     None         Valarie Merino, MD 08/26/22 1128

## 2022-08-26 NOTE — ED Provider Triage Note (Signed)
Emergency Medicine Provider Triage Evaluation Note  Monique Shannon , a 66 y.o. female  was evaluated in triage.  Pt complains of .Left shoulder pain.  Started about 2 to 3 months ago, is worse with any movement.  No neck pain, chest pain, shortness of breath.  Denies any specific trauma or injury to the shoulder.  Review of Systems  Per HPI  Physical Exam  BP (!) 163/90 (BP Location: Right Arm)   Pulse 70   Temp 98.1 F (36.7 C) (Oral)   Resp 18   SpO2 99%  Gen:   Awake, no distress   Resp:  Normal effort  MSK:   Moves extremities without difficulty  Other:  Tenderness over the scapular spine.  Decreased ROM to left shoulder secondary to pain but is able to Abduct and adduct fully.  Medical Decision Making  Medically screening exam initiated at 9:35 AM.  Appropriate orders placed.  Monique Shannon was informed that the remainder of the evaluation will be completed by another provider, this initial triage assessment does not replace that evaluation, and the importance of remaining in the ED until their evaluation is complete.     Sherrill Raring, [place] 08/26/22 [phone]

## 2022-08-26 NOTE — Discharge Instructions (Addendum)
  Return for any problem.  Follow-up with Dr. Mardelle Matte as instructed.  Your left shoulder may benefit from the same treatments that your right shoulder received.  Use Tylenol and ibuprofen as instructed for pain.  Given your history of GERD, I would recommend Tylenol over ibuprofen.  Tylenol can be taken every 6-8 hours.  An appropriate dose for you would be 2 extra strength Tylenol at 1 time.  You may trial ibuprofen.  However, take it with food and be aware that this may exacerbate stomach upset / GERD symptoms.

## 2022-10-27 DIAGNOSIS — M19012 Primary osteoarthritis, left shoulder: Secondary | ICD-10-CM | POA: Diagnosis not present

## 2022-10-27 DIAGNOSIS — M19011 Primary osteoarthritis, right shoulder: Secondary | ICD-10-CM | POA: Diagnosis not present
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
## 2023-07-11 ENCOUNTER — Inpatient Hospital Stay (HOSPITAL_BASED_OUTPATIENT_CLINIC_OR_DEPARTMENT_OTHER): Admission: RE | Admit: 2023-07-11 | Payer: 59 | Source: Ambulatory Visit | Admitting: Radiology

## 2023-07-11 DIAGNOSIS — Z1231 Encounter for screening mammogram for malignant neoplasm of breast: Secondary | ICD-10-CM

## 2024-06-13 ENCOUNTER — Other Ambulatory Visit: Payer: Self-pay | Admitting: Internal Medicine

## 2024-06-13 DIAGNOSIS — Z1231 Encounter for screening mammogram for malignant neoplasm of breast: Secondary | ICD-10-CM

## 2024-06-15 ENCOUNTER — Ambulatory Visit
